# Patient Record
Sex: Male | Born: 1970 | Race: White | Hispanic: No | Marital: Single | Smoking: Never smoker
Health system: Southern US, Community
[De-identification: ages and names within clinical notes are randomized; demographics above are authoritative.]

## PROBLEM LIST (undated history)

## (undated) DIAGNOSIS — F988 Other specified behavioral and emotional disorders with onset usually occurring in childhood and adolescence: Secondary | ICD-10-CM

## (undated) DIAGNOSIS — F419 Anxiety disorder, unspecified: Secondary | ICD-10-CM

## (undated) HISTORY — PX: SHOULDER ARTHROSCOPY: SHX128

---

## 2002-10-26 ENCOUNTER — Encounter: Payer: Self-pay | Admitting: Family Medicine

## 2002-10-26 ENCOUNTER — Ambulatory Visit (HOSPITAL_COMMUNITY): Admission: RE | Admit: 2002-10-26 | Discharge: 2002-10-26 | Payer: Self-pay | Admitting: Family Medicine

## 2003-03-03 ENCOUNTER — Emergency Department (HOSPITAL_COMMUNITY): Admission: EM | Admit: 2003-03-03 | Discharge: 2003-03-03 | Payer: Self-pay | Admitting: Emergency Medicine

## 2003-03-03 ENCOUNTER — Encounter: Payer: Self-pay | Admitting: Emergency Medicine

## 2004-07-20 ENCOUNTER — Ambulatory Visit: Payer: Self-pay | Admitting: Occupational Therapy

## 2006-07-01 ENCOUNTER — Emergency Department (HOSPITAL_COMMUNITY): Admission: EM | Admit: 2006-07-01 | Discharge: 2006-07-02 | Payer: Self-pay | Admitting: Emergency Medicine

## 2007-03-10 ENCOUNTER — Observation Stay (HOSPITAL_COMMUNITY): Admission: EM | Admit: 2007-03-10 | Discharge: 2007-03-11 | Payer: Self-pay | Admitting: Emergency Medicine

## 2007-03-10 ENCOUNTER — Ambulatory Visit: Payer: Self-pay | Admitting: Internal Medicine

## 2007-03-16 ENCOUNTER — Encounter: Admission: RE | Admit: 2007-03-16 | Discharge: 2007-03-16 | Payer: Self-pay | Admitting: Family Medicine

## 2009-01-13 ENCOUNTER — Ambulatory Visit: Payer: Self-pay | Admitting: Diagnostic Radiology

## 2009-01-13 ENCOUNTER — Ambulatory Visit (HOSPITAL_BASED_OUTPATIENT_CLINIC_OR_DEPARTMENT_OTHER): Admission: RE | Admit: 2009-01-13 | Discharge: 2009-01-13 | Payer: Self-pay | Admitting: Family Medicine

## 2010-06-07 ENCOUNTER — Encounter: Payer: Self-pay | Admitting: Family Medicine
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
## 2010-09-29 NOTE — Discharge Summary (Signed)
NAMEBRAYEN, BUNN             ACCOUNT NO.:  [id number]   MEDICAL RECORD NO.:  [id number]          PATIENT TYPE:  INP   LOCATION:  4733                         FACILITY:  [facility]   PHYSICIAN:  Michelene Gardener, MD    DATE OF BIRTH:  Jan 29, 1971   DATE OF ADMISSION:  03/10/2007  DATE OF DISCHARGE:  03/11/2007                               DISCHARGE SUMMARY   PRIMARY CARE PHYSICIAN:  Dr. Lenise Arena.   DISCHARGE DIAGNOSES:  1. Chest pain.  2. Anxiety.   DISCHARGE MEDICATIONS:  Xanax 0.25 mg p.o. q.8 h. as needed.   CONSULTATIONS:  None.   PROCEDURE:  None.   FOLLOW UP:  Follow up appointment with primary physician to 1-2 weeks  daily.   DIAGNOSTICS:  Chest x-ray done March 10, 2007 and it showed no  evidence of acute problem.   HOSPITAL COURSE:  This is a young 40 year old male who presented to the  hospital complaining of left-sided chest pain.  His pain is very  atypical and had some pleuritic component.  Chest x-ray was done and  showed no evidence of pneumonia.  D-dimers were done and they came to be  normal, so it is felt not necessary to proceed with further testing for  PE.  The patient was monitored in telemetry overnight.  Three sets of  troponin and cardiac enzymes were done and they came to be normal.  Electrocardiogram showed no evidence of acute ischemia.  His tele  monitor showed no evidence of  arrhythmia.  His symptoms were all felt  secondary to stress and anxiety.  He stated that he was very stressed  out from his current job.  He was given 15 tablets of Xanax.  He was  told to take them only when he needs them.  He was also advised to  follow as quick as possible with primary physician to re-evaluate him  and to see if he needs to proceed with further testing to include a  stress test.   Otherwise, other medical conditions remained stable during the hospital  with stable vitals.   Assessment time is 40 minutes.      Michelene Gardener, MD  Electronically Signed     NAE/MEDQ  D:  03/15/2007  T:  03/16/2007  Job:  [id number]

## 2011-02-24 LAB — CK TOTAL AND CKMB (NOT AT ARMC)
Relative Index: 0.8
Relative Index: 0.9
Relative Index: 0.9
Total CK: 156

## 2011-02-24 LAB — I-STAT 8, (EC8 V) (CONVERTED LAB)
BUN: 13
Bicarbonate: 25.7 — ABNORMAL HIGH
Glucose, Bld: 98
Sodium: 141
pH, Ven: 7.385 — ABNORMAL HIGH

## 2011-02-24 LAB — CBC
MCHC: 33.5
RBC: 4.9
WBC: 7.8

## 2011-02-24 LAB — POCT CARDIAC MARKERS
Myoglobin, poc: 88.7
Operator id: 272551
Troponin i, poc: <0.05

## 2011-02-24 LAB — TROPONIN I: Troponin I: <0.01

## 2011-02-24 LAB — D-DIMER, QUANTITATIVE: D-Dimer, Quant: <0.22

## 2013-05-07 ENCOUNTER — Encounter (HOSPITAL_BASED_OUTPATIENT_CLINIC_OR_DEPARTMENT_OTHER): Payer: Self-pay | Admitting: *Deleted

## 2013-05-07 NOTE — Progress Notes (Signed)
No labs needed

## 2013-05-08 ENCOUNTER — Encounter (HOSPITAL_BASED_OUTPATIENT_CLINIC_OR_DEPARTMENT_OTHER): Payer: Self-pay | Admitting: *Deleted

## 2013-05-14 ENCOUNTER — Encounter (HOSPITAL_BASED_OUTPATIENT_CLINIC_OR_DEPARTMENT_OTHER): Payer: Self-pay | Admitting: Anesthesiology

## 2013-05-14 ENCOUNTER — Ambulatory Visit (HOSPITAL_BASED_OUTPATIENT_CLINIC_OR_DEPARTMENT_OTHER): Admission: RE | Admit: 2013-05-14 | Payer: 59 | Source: Ambulatory Visit | Admitting: Orthopedic Surgery

## 2013-05-14 HISTORY — DX: Anxiety disorder, unspecified: F41.9

## 2013-05-14 HISTORY — DX: Other specified behavioral and emotional disorders with onset usually occurring in childhood and adolescence: F98.8

## 2013-05-14 SURGERY — TENODESIS, BICEPS
Anesthesia: General | Laterality: Right

## 2013-05-14 MED ORDER — MIDAZOLAM HCL 2 MG/2ML IJ SOLN
INTRAMUSCULAR | Status: AC
  Filled 2013-05-14: qty 2

## 2013-05-14 MED ORDER — FENTANYL CITRATE 0.05 MG/ML IJ SOLN
INTRAMUSCULAR | Status: AC
  Filled 2013-05-14: qty 4

## 2013-05-14 MED ORDER — BUPIVACAINE HCL (PF) 0.5 % IJ SOLN
INTRAMUSCULAR | Status: AC
  Filled 2013-05-14: qty 30

## 2013-05-14 MED ORDER — PROPOFOL 10 MG/ML IV BOLUS
INTRAVENOUS | Status: AC
  Filled 2013-05-14: qty 20

## 2013-05-14 NOTE — Anesthesia Preprocedure Evaluation (Deleted)
Anesthesia Evaluation  Patient identified by MRN, date of birth, ID band Patient awake    Reviewed: Allergy & Precautions, H&P , NPO status , Patient's Chart, lab work & pertinent test results  History of Anesthesia Complications Negative for: history of anesthetic complications  Airway       Dental   Pulmonary neg pulmonary ROS,          Cardiovascular negative cardio ROS      Neuro/Psych Anxiety negative neurological ROS     GI/Hepatic   Endo/Other    Renal/GU      Musculoskeletal   Abdominal Normal abdominal exam  (+)   Peds  Hematology   Anesthesia Other Findings   Reproductive/Obstetrics                          Anesthesia Physical Anesthesia Plan Anesthesia Quick Evaluation

## 2013-05-31 ENCOUNTER — Other Ambulatory Visit: Payer: Self-pay | Admitting: Orthopedic Surgery

## 2018-05-03 ENCOUNTER — Other Ambulatory Visit: Payer: Self-pay | Admitting: Neurological Surgery

## 2018-05-03 DIAGNOSIS — M5412 Radiculopathy, cervical region: Secondary | ICD-10-CM

## 2018-05-08 ENCOUNTER — Other Ambulatory Visit: Payer: Self-pay | Admitting: Neurological Surgery

## 2018-05-08 DIAGNOSIS — M5412 Radiculopathy, cervical region: Secondary | ICD-10-CM

## 2018-05-14 ENCOUNTER — Ambulatory Visit
Admission: RE | Admit: 2018-05-14 | Discharge: 2018-05-14 | Disposition: A | Discharge status: Home / Self Care | Payer: 59 | Source: Ambulatory Visit | Attending: Neurological Surgery | Admitting: Neurological Surgery

## 2018-05-14 DIAGNOSIS — M5412 Radiculopathy, cervical region: Secondary | ICD-10-CM

## 2018-05-19 ENCOUNTER — Inpatient Hospital Stay: Admission: RE | Admit: 2018-05-19 | Payer: Self-pay | Source: Ambulatory Visit

## 2018-08-25 ENCOUNTER — Emergency Department (HOSPITAL_COMMUNITY)
Admission: EM | Admit: 2018-08-25 | Discharge: 2018-08-25 | Disposition: A | Discharge status: Home / Self Care | Payer: No Typology Code available for payment source | Attending: Emergency Medicine | Admitting: Emergency Medicine

## 2018-08-25 ENCOUNTER — Encounter (HOSPITAL_COMMUNITY): Payer: Self-pay

## 2018-08-25 ENCOUNTER — Emergency Department (HOSPITAL_COMMUNITY): Payer: No Typology Code available for payment source

## 2018-08-25 ENCOUNTER — Other Ambulatory Visit: Payer: Self-pay

## 2018-08-25 DIAGNOSIS — Y990 Civilian activity done for income or pay: Secondary | ICD-10-CM | POA: Diagnosis not present

## 2018-08-25 DIAGNOSIS — M5412 Radiculopathy, cervical region: Secondary | ICD-10-CM

## 2018-08-25 DIAGNOSIS — M79632 Pain in left forearm: Secondary | ICD-10-CM | POA: Insufficient documentation

## 2018-08-25 DIAGNOSIS — M25512 Pain in left shoulder: Secondary | ICD-10-CM | POA: Diagnosis present

## 2018-08-25 DIAGNOSIS — X500XXA Overexertion from strenuous movement or load, initial encounter: Secondary | ICD-10-CM | POA: Insufficient documentation

## 2018-08-25 DIAGNOSIS — Y9389 Activity, other specified: Secondary | ICD-10-CM | POA: Insufficient documentation

## 2018-08-25 DIAGNOSIS — Z79899 Other long term (current) drug therapy: Secondary | ICD-10-CM | POA: Diagnosis not present

## 2018-08-25 DIAGNOSIS — Y9289 Other specified places as the place of occurrence of the external cause: Secondary | ICD-10-CM | POA: Diagnosis not present

## 2018-08-25 DIAGNOSIS — M79602 Pain in left arm: Secondary | ICD-10-CM

## 2018-08-25 MED ORDER — BACITRACIN ZINC 500 UNIT/GM EX OINT
TOPICAL_OINTMENT | CUTANEOUS | Status: AC
  Filled 2018-08-25: qty 0.9

## 2018-08-25 MED ORDER — METHOCARBAMOL 500 MG PO TABS: 500.0000 mg | ORAL_TABLET | Freq: Two times a day (BID) | ORAL | 0 refills | Status: AC

## 2018-08-25 NOTE — ED Notes (Signed)
ED Provider at bedside. 
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
## 2018-08-25 NOTE — ED Notes (Signed)
Patient transported to X-ray 
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
## 2020-08-20 ENCOUNTER — Encounter (HOSPITAL_COMMUNITY): Payer: Self-pay

## 2020-08-20 ENCOUNTER — Other Ambulatory Visit: Payer: Self-pay

## 2020-08-20 ENCOUNTER — Emergency Department (HOSPITAL_COMMUNITY)
Admission: EM | Admit: 2020-08-20 | Discharge: 2020-08-20 | Disposition: A | Discharge status: Home / Self Care | Payer: No Typology Code available for payment source | Attending: Emergency Medicine | Admitting: Emergency Medicine

## 2020-08-20 DIAGNOSIS — Y990 Civilian activity done for income or pay: Secondary | ICD-10-CM | POA: Diagnosis not present

## 2020-08-20 DIAGNOSIS — S61213A Laceration without foreign body of left middle finger without damage to nail, initial encounter: Secondary | ICD-10-CM | POA: Diagnosis not present

## 2020-08-20 DIAGNOSIS — Z7721 Contact with and (suspected) exposure to potentially hazardous body fluids: Secondary | ICD-10-CM | POA: Diagnosis not present

## 2020-08-20 DIAGNOSIS — W268XXA Contact with other sharp object(s), not elsewhere classified, initial encounter: Secondary | ICD-10-CM | POA: Insufficient documentation

## 2020-08-20 DIAGNOSIS — S6992XA Unspecified injury of left wrist, hand and finger(s), initial encounter: Secondary | ICD-10-CM | POA: Diagnosis present

## 2020-08-20 DIAGNOSIS — Z23 Encounter for immunization: Secondary | ICD-10-CM | POA: Insufficient documentation

## 2020-08-20 LAB — RAPID HIV SCREEN (HIV 1/2 AB+AG)
HIV 1/2 Antibodies: NONREACTIVE
HIV-1 P24 Antigen - HIV24: NONREACTIVE

## 2020-08-20 LAB — COMPREHENSIVE METABOLIC PANEL
ALT: 36 U/L (ref 0–44)
AST: 35 U/L (ref 15–41)
Albumin: 4.3 g/dL (ref 3.5–5.0)
Alkaline Phosphatase: 39 U/L (ref 38–126)
Anion gap: 8 (ref 5–15)
BUN: 27 mg/dL — ABNORMAL HIGH (ref 6–20)
CO2: 22 mmol/L (ref 22–32)
Calcium: 9.5 mg/dL (ref 8.9–10.3)
Chloride: 106 mmol/L (ref 98–111)
Creatinine, Ser: 1.48 mg/dL — ABNORMAL HIGH (ref 0.61–1.24)
GFR, Estimated: 57 mL/min — ABNORMAL LOW (ref >60)
Glucose, Bld: 82 mg/dL (ref 70–99)
Potassium: 4 mmol/L (ref 3.5–5.1)
Sodium: 136 mmol/L (ref 135–145)
Total Bilirubin: 0.9 mg/dL (ref 0.3–1.2)
Total Protein: 7.7 g/dL (ref 6.5–8.1)

## 2020-08-20 MED ORDER — TETANUS-DIPHTH-ACELL PERTUSSIS 5-2.5-18.5 LF-MCG/0.5 IM SUSY
0.5000 mL | PREFILLED_SYRINGE | Freq: Once | INTRAMUSCULAR | Status: AC
  Administered 2020-08-20: 0.5 mL via INTRAMUSCULAR
  Filled 2020-08-20: qty 0.5
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
## 2020-08-21 LAB — RPR: RPR Ser Ql: NONREACTIVE

## 2020-08-21 LAB — HEPATITIS C ANTIBODY: HCV Ab: NONREACTIVE

## 2020-08-21 LAB — HEPATITIS B SURFACE ANTIGEN: Hepatitis B Surface Ag: NONREACTIVE
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
## 2022-01-02 ENCOUNTER — Emergency Department (HOSPITAL_BASED_OUTPATIENT_CLINIC_OR_DEPARTMENT_OTHER)
Admission: EM | Admit: 2022-01-02 | Discharge: 2022-01-02 | Disposition: A | Discharge status: Home / Self Care | Payer: 59 | Attending: Emergency Medicine | Admitting: Emergency Medicine

## 2022-01-02 ENCOUNTER — Other Ambulatory Visit: Payer: Self-pay

## 2022-01-02 DIAGNOSIS — X088XXA Exposure to other specified smoke, fire and flames, initial encounter: Secondary | ICD-10-CM | POA: Diagnosis not present

## 2022-01-02 DIAGNOSIS — T22211A Burn of second degree of right forearm, initial encounter: Secondary | ICD-10-CM | POA: Insufficient documentation

## 2022-01-02 LAB — CBC WITH DIFFERENTIAL/PLATELET
Abs Immature Granulocytes: 0.03 10*3/uL (ref 0.00–0.07)
Basophils Absolute: 0 10*3/uL (ref 0.0–0.1)
Basophils Relative: 0 %
Eosinophils Absolute: 0.5 10*3/uL (ref 0.0–0.5)
Eosinophils Relative: 6 %
HCT: 51.1 % (ref 39.0–52.0)
Hemoglobin: 16.9 g/dL (ref 13.0–17.0)
Immature Granulocytes: 0 %
Lymphocytes Relative: 18 %
Lymphs Abs: 1.7 10*3/uL (ref 0.7–4.0)
MCH: 31 pg (ref 26.0–34.0)
MCHC: 33.1 g/dL (ref 30.0–36.0)
MCV: 93.8 fL (ref 80.0–100.0)
Monocytes Absolute: 0.8 10*3/uL (ref 0.1–1.0)
Monocytes Relative: 9 %
Neutro Abs: 6.1 10*3/uL (ref 1.7–7.7)
Neutrophils Relative %: 67 %
Platelets: 251 10*3/uL (ref 150–400)
RBC: 5.45 MIL/uL (ref 4.22–5.81)
RDW: 13.8 % (ref 11.5–15.5)
WBC: 9.1 10*3/uL (ref 4.0–10.5)
nRBC: 0 % (ref 0.0–0.2)

## 2022-01-02 LAB — BASIC METABOLIC PANEL
Anion gap: 7 (ref 5–15)
BUN: 20 mg/dL (ref 6–20)
CO2: 24 mmol/L (ref 22–32)
Calcium: 9.1 mg/dL (ref 8.9–10.3)
Chloride: 103 mmol/L (ref 98–111)
Creatinine, Ser: 1.38 mg/dL — ABNORMAL HIGH (ref 0.61–1.24)
GFR, Estimated: >60 mL/min (ref >60)
Glucose, Bld: 94 mg/dL (ref 70–99)
Potassium: 4.2 mmol/L (ref 3.5–5.1)
Sodium: 134 mmol/L — ABNORMAL LOW (ref 135–145)

## 2022-01-02 LAB — LACTIC ACID, PLASMA: Lactic Acid, Venous: 0.9 mmol/L (ref 0.5–1.9)

## 2022-01-02 MED ORDER — BACITRACIN ZINC 500 UNIT/GM EX OINT: TOPICAL_OINTMENT | Freq: Once | CUTANEOUS | Status: AC

## 2022-01-02 MED ORDER — NAPROXEN 375 MG PO TABS: 375.0000 mg | ORAL_TABLET | Freq: Two times a day (BID) | ORAL | 0 refills | Status: AC

## 2022-01-02 NOTE — Discharge Instructions (Addendum)
Apply over-the-counter antibiotic ointment to the blistered areas on your forearm and hand.  Take the medications to help with pain and swelling.  Try to keep her arm elevated.  Follow-up with a primary care doctor to be rechecked.  [facility] also does have a specialized burn center in clinic [phone]
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
## 2024-03-20 ENCOUNTER — Encounter: Payer: Self-pay | Admitting: "Endocrinology

## 2024-03-20 ENCOUNTER — Ambulatory Visit: Admitting: "Endocrinology

## 2024-03-20 VITALS — BP 134/80 | HR 80 | Ht 71.0 in | Wt 210.0 lb

## 2024-03-20 DIAGNOSIS — E291 Testicular hypofunction: Secondary | ICD-10-CM

## 2024-03-20 NOTE — Progress Notes (Signed)
 Outpatient Endocrinology Note Roger Birmingham, MD    [name] Feb 22, 1971 [id number]  Referring Provider: [name], NP Primary Care Provider: Patient, No Pcp Per Reason for consultation: Subjective   Assessment & Plan  Diagnoses and all orders for this visit:  Hypogonadism in male -     Testosterone Total,Free,Bio, Males  Reviewed urology notes at length: patient at one point has had testosterone level as high as 1400 and hence repletion was not recommended by the urologist.  He was later started on the testosterone in 07/13/2023 after which his labs were variable from as low as 64 to as high as 1385. Need two 8 AM low testosterone values to confirm the diagnosis of hypogonadism Reviewed patient's records at length, patient has had one low testosterone reading in the 60s which is low enough to be considered regardless of the time it was drawn, but no other 8 AM low reading found. Patient is not interested in holding off the therapy for a repeat 8 AM testing Patient is upset and prefers to follow back where he is referred from, that is urology Explained to the patient at length, answered all his questions, welcomed patient back if he wants to proceed  Ordered lab in case patient wanted to follow-up with me  No follow-ups on file.   I have reviewed current medications, nurse's notes, allergies, vital signs, past medical and surgical history, family medical history, and social history for this encounter. Counseled patient on symptoms, examination findings, lab findings, imaging results, treatment decisions and monitoring and prognosis. The patient understood the recommendations and agrees with the treatment plan. All questions regarding treatment plan were fully answered.  Roger Birmingham, MD  03/20/24   History of Present Illness HPI  [name] is a 53 y.o. male with  referred by Dr. Baldwin for evaluation and management of hypogonadism.   He is on  testosterone cypionate IM 100mg  weekly in 07/2023. He was on it in the past a long time ago.  He has  No h/o mumps in childhood Yes head or genital trauma, lost consciousness a couple of times a few times during sports without any work up done, no genital trauma  Yes use of OTC supplements, herbal supplements or anabolic steroids: takes buffered creatine  No poor libido No erectile dysfunction Yes early morning spontaneous erection, sometimes Yes stimulated penile erection, sexually active  No loss of energy or vim No irritability/low mood No change in sexual hair growth/shaving habit Yeschange in muscle tone/mass No hot flashes No breast enlargement No nipple discharge  No decrease in testicular size No weight gain.  No has osteopenia/osteoporosis No history of low trauma fractures  No symptoms or history of obstructive sleep apnea No a history of chronic opioid use No BPH previously  No history of cardiovascular disease  No history of stroke No history of polycythemia No LUTS including no frequency, no hesitancy, no incomplete bladder emptying and no urgency No a previous diagnosis of prostate/testicular cancer    Physical Exam  BP 134/80   Pulse 80   Ht 5' 11 (1.803 m)   Wt 210 lb (95.3 kg)   SpO2 99%   BMI 29.29 kg/m    Constitutional: well developed, well nourished Head: normocephalic, atraumatic Eyes: sclera anicteric, no redness Neck: supple Lungs: normal respiratory effort Neurology: alert and oriented Skin: dry, no appreciable rashes Musculoskeletal: no appreciable defects Psychiatric: normal mood and affect   Current Medications Patient's Medications  New  Prescriptions   No medications on file  Previous Medications   AMPHETAMINE-DEXTROAMPHETAMINE (ADDERALL XR) 10 MG 24 HR CAPSULE    Take 10 mg by mouth daily.   METHOCARBAMOL  (ROBAXIN ) 500 MG TABLET    Take 1 tablet (500 mg total) by mouth 2 (two) times daily.   MONTELUKAST (SINGULAIR) 10 MG TABLET     Take 10 mg by mouth daily.   NAPROXEN  (NAPROSYN ) 375 MG TABLET    Take 1 tablet (375 mg total) by mouth 2 (two) times daily.   OXYCODONE-ACETAMINOPHEN (PERCOCET/ROXICET) 5-325 MG PER TABLET    Take by mouth every 4 (four) hours as needed for severe pain.   SERTRALINE (ZOLOFT) 100 MG TABLET    Take 100 mg by mouth daily.   TESTOSTERONE CYPIONATE (DEPOTESTOSTERONE CYPIONATE) 200 MG/ML INJECTION    SMARTSIG:0.5 Milliliter(s) IM Once a Week  Modified Medications   No medications on file  Discontinued Medications   No medications on file    Allergies No Known Allergies  Past Medical History Past Medical History:  Diagnosis Date   ADD (attention deficit disorder)    Anxiety     Past Surgical History Past Surgical History:  Procedure Laterality Date   SHOULDER ARTHROSCOPY  2003,2014   right    Family History family history is not on file.  Social History Social History   Socioeconomic History   Marital status: Single    Spouse name: Not on file   Number of children: Not on file   Years of education: Not on file   Highest education level: Not on file  Occupational History   Not on file  Tobacco Use   Smoking status: Never   Smokeless tobacco: Not on file  Substance and Sexual Activity   Alcohol use: Yes    Comment: occ   Drug use: No   Sexual activity: Not on file  Other Topics Concern   Not on file  Social History Narrative   Not on file   Social Drivers of Health   Financial Resource Strain: Patient Declined (06/23/2023)   Received from [facility]   Overall Financial Resource Strain (CARDIA)    Difficulty of Paying Living Expenses: Patient declined  Food Insecurity: Patient Declined (06/23/2023)   Received from [facility]   Hunger Vital Sign    Within the past 12 months, you worried that your food would run out before you got the money to buy more.: Patient declined    Within the past 12 months, the food you bought just didn't last and you didn't have  money to get more.: Patient declined  Transportation Needs: Patient Declined (06/23/2023)   [facility] - [facility]    Lack of Transportation (Medical): Patient declined    Lack of Transportation (Non-Medical): Patient declined  Physical Activity: Unknown (06/23/2023)   Received from [facility]   Exercise Vital Sign    On average, how many days per week do you engage in moderate to strenuous exercise (like a brisk walk)?: Patient declined    Minutes of Exercise per Session: Not on file  Stress: Patient Declined (06/23/2023)   Received from [facility] - Occupational Stress Questionnaire    Feeling of Stress : Patient declined  Social Connections: Patient Declined (06/23/2023)   Received from [facility]   Social Network    How would you rate your social network (family, work, friends)?: Patient declined  Intimate Partner Violence: Patient Declined (  06/23/2023)   Received from [facility]   HITS    Over the last 12 months how often did your partner physically hurt you?: Patient declined    Over the last 12 months how often did your partner insult you or talk down to you?: Patient declined    Over the last 12 months how often did your partner threaten you with physical harm?: Patient declined    Over the last 12 months how often did your partner scream or curse at you?: Patient declined    No results found for: CHOL No results found for: HDL No results found for: LDLCALC No results found for: TRIG No results found for: [facility] Lab Results  Component Value Date   CREATININE 1.38 (H) 01/02/2022   No results found for: GFR    Component Value Date/Time   NA 134 (L) 01/02/2022 1850   K 4.2 01/02/2022 1850   CL 103 01/02/2022 1850   CO2 24 01/02/2022 1850   GLUCOSE 94 01/02/2022 1850   BUN 20 01/02/2022 1850   CREATININE 1.38 (H) 01/02/2022 1850   CALCIUM 9.1 01/02/2022 1850   PROT 7.7  08/20/2020 2047   ALBUMIN 4.3 08/20/2020 2047   AST 35 08/20/2020 2047   ALT 36 08/20/2020 2047   ALKPHOS 39 08/20/2020 2047   BILITOT 0.9 08/20/2020 2047   GFRNONAA >60 01/02/2022 1850      Latest Ref Rng & Units 01/02/2022    6:50 PM 08/20/2020    8:47 PM 03/10/2007    6:25 PM  BMP  Glucose 70 - 99 mg/dL 94  82  98   BUN 6 - 20 mg/dL 20  27  13    Creatinine 0.61 - 1.24 mg/dL 8.61  8.51  1.4   Sodium 135 - 145 mmol/L 134  136  141   Potassium 3.5 - 5.1 mmol/L 4.2  4.0  3.9   Chloride 98 - 111 mmol/L 103  106  108   CO2 22 - 32 mmol/L 24  22    Calcium 8.9 - 10.3 mg/dL 9.1  9.5         Component Value Date/Time   WBC 9.1 01/02/2022 1850   RBC 5.45 01/02/2022 1850   HGB 16.9 01/02/2022 1850   HCT 51.1 01/02/2022 1850   PLT 251 01/02/2022 1850   MCV 93.8 01/02/2022 1850   MCH 31.0 01/02/2022 1850   MCHC 33.1 01/02/2022 1850   RDW 13.8 01/02/2022 1850   LYMPHSABS 1.7 01/02/2022 1850   MONOABS 0.8 01/02/2022 1850   EOSABS 0.5 01/02/2022 1850   BASOSABS 0.0 01/02/2022 1850   No results found for: TSH, [id number]       Parts of this note may have been dictated using voice recognition software. There may be variances in spelling and vocabulary which are unintentional. Not all errors are proofread. Please notify the dino if any discrepancies are noted or if the meaning of any statement is not clear.
